# Patient Record
Sex: Male | Born: 1990 | Race: White | Hispanic: No | Marital: Married | Smoking: Never smoker
Health system: Southern US, Community
[De-identification: ages and names within clinical notes are randomized; demographics above are authoritative.]

## PROBLEM LIST (undated history)

## (undated) DIAGNOSIS — F419 Anxiety disorder, unspecified: Secondary | ICD-10-CM

## (undated) DIAGNOSIS — K219 Gastro-esophageal reflux disease without esophagitis: Secondary | ICD-10-CM

## (undated) HISTORY — DX: Gastro-esophageal reflux disease without esophagitis: K21.9

## (undated) HISTORY — DX: Anxiety disorder, unspecified: F41.9

---
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
## 2010-01-07 ENCOUNTER — Telehealth: Payer: Self-pay | Admitting: Family Medicine

## 2010-02-01 ENCOUNTER — Telehealth: Payer: Self-pay | Admitting: Family Medicine

## 2010-02-01 ENCOUNTER — Ambulatory Visit
Admission: RE | Admit: 2010-02-01 | Discharge: 2010-02-01 | Payer: Self-pay | Source: Home / Self Care | Attending: Family Medicine | Admitting: Family Medicine

## 2010-02-01 ENCOUNTER — Encounter: Payer: Self-pay | Admitting: Family Medicine

## 2010-02-10 NOTE — Letter (Signed)
Summary: Generic Letter  [place] [facility]  [address] [place], [place] [address]   Phone: [phone]  Fax: [phone]    02/01/2010  Regarding:  Ronald Gamble [address] [place], [place]  [address]  To Whom it May Concern,  Ronald Gamble is a patient of mine and had assessment on date above.  He has  no heart murmur and EKG is unremarkable.  We do not find any contraindications for engaging in physical training  activity for the [employer] at this time.         Sincerely,   Evelena Peat MD

## 2010-02-10 NOTE — Assessment & Plan Note (Signed)
Summary: needs ekg//ccm   Vital Signs:  Patient profile:   20 year old male Weight:      173 pounds Temp:     98.1 degrees F oral BP sitting:   110 / 78  (left arm) Cuff size:   regular  Vitals Entered By: Sid Falcon LPN (February 01, 2010 8:22 AM) CC: EKG needed for [employer]   History of Present Illness: Patient here for assessment prior to entering the [employer]. Was requested that he obtain EKG. Apparently he mentioned on intake questionnaire that he had history of heart murmur as a child. No history of syncope. No dizziness with exercise. No chest pains. Recent exercise with good tolerance. No history of any known arrhythmias or other heart problems. No history of asthma.  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    Physical Exam  General:  Well-developed,well-nourished,in no acute distress; alert,appropriate and cooperative throughout examination Ears:  External ear exam shows no significant lesions or deformities.  Otoscopic examination reveals clear canals, tympanic membranes are intact bilaterally without bulging, retraction, inflammation or discharge. Hearing is grossly normal bilaterally. Mouth:  Oral mucosa and oropharynx without lesions or exudates.  Teeth in good repair. Neck:  No deformities, masses, or tenderness noted. Lungs:  Normal respiratory effort, chest expands symmetrically. Lungs are clear to auscultation, no crackles or  wheezes. Heart:  Normal rate and regular rhythm. S1 and S2 normal without gallop, murmur, click, rub or other extra sounds. Extremities:  no edema   Impression & Recommendations:  Problem # 1:  SCREENING FOR UNSPECIFIED CONDITION (ICD-V82.9)  reported hx of murmur.  None noted today.  EKG is normal.  No contraindictions from exam or hx for  physical activity.  Orders: EKG w/ Interpretation (93000)  Complete Medication List: 1)  Naproxen 500 Mg Tabs (Naproxen) .... One by mouth q 12 hours as needed   Orders Added: 1)  EKG w/ Interpretation [93000] 2)  Est. Patient Level III [28413]

## 2010-02-10 NOTE — Letter (Signed)
Summary: Medical Clearance Form for College  Medical Clearance Form for College   Imported By: Maryln Gottron 12/22/2009 10:24:53  _____________________________________________________________________  External Attachment:    Type:   Image     Comment:   External Document

## 2010-02-10 NOTE — Assessment & Plan Note (Signed)
Summary: cpx/njr   Vital Signs:  Patient profile:   20 year old male Height:      67 inches Weight:      182 pounds O2 Sat:      97 % Temp:     98.1 degrees F Pulse rate:   82 / minute BP sitting:   120 / 84  (left arm)  Vitals Entered By: Pura Spice, RN (December 17, 2009 1:39 PM) CC: college form  completion    History of Present Illness: Patient here for well visit. 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 No chronic medical problems. Takes no medications. Allergy to Vantin.  No complaints at this time. Immunization records reviewed and are up-to-date. Negative PPD placed at outside institution that he'll try to get records.  he has no specific complaints at this time.  BP have been borderline elevated in past but OK today.  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 Alcohol use-no  Risk Factors: Smoking Status: never (12/08/2008) PMH-FH-SH reviewed for relevance  Review of Systems  The patient denies anorexia, fever, weight loss, weight gain, vision loss, decreased hearing, hoarseness, chest pain, syncope, dyspnea on exertion, peripheral edema, prolonged cough, headaches, hemoptysis, abdominal pain, melena, hematochezia, severe indigestion/heartburn, hematuria, incontinence, genital sores, muscle weakness, suspicious skin lesions, transient blindness, difficulty walking, depression, unusual weight change, abnormal bleeding, enlarged lymph nodes, and testicular masses.    Physical Exam  General:  Well-developed,well-nourished,in no acute distress; alert,appropriate and cooperative throughout examination Head:  Normocephalic and atraumatic without obvious abnormalities. No apparent  alopecia or balding. Eyes:  No corneal or conjunctival inflammation noted. EOMI. Perrla. Funduscopic exam benign, without hemorrhages, exudates or papilledema. Vision grossly normal. Ears:  External ear exam shows no significant lesions or deformities.  Otoscopic examination reveals clear canals, tympanic membranes are intact bilaterally without bulging, retraction, inflammation or discharge. Hearing is grossly normal bilaterally. Mouth:  Oral mucosa and oropharynx without lesions or exudates.  Teeth in good repair. Neck:  No deformities, masses, or tenderness noted. Lungs:  Normal respiratory effort, chest expands symmetrically. Lungs are clear to auscultation, no crackles or wheezes. Heart:  Normal rate and regular rhythm. S1 and S2 normal without gallop, murmur, click, rub or other extra sounds. Abdomen:  Bowel sounds positive,abdomen soft and non-tender without masses, organomegaly or hernias noted. Msk:  No deformity or scoliosis noted of thoracic or lumbar spine.   Extremities:  No clubbing, cyanosis, edema, or deformity noted with normal full range of motion of all joints.   Neurologic:  alert & oriented X3 and cranial nerves II-XII intact.   Skin:  no rashes and no suspicious lesions.   Cervical Nodes:  No lymphadenopathy noted   Impression & Recommendations:  Problem # 1:  Preventive Health Care (ICD-V70.0) Immunizations up to date.  Paperwork completed for EMS training.   Orders Added: 1)  Est. Patient 18-39 years [99395]

## 2010-02-10 NOTE — Letter (Signed)
Summary: Generic Letter  [place] [facility]  [address] [place], [place] [address]   Phone: [phone]  Fax: [phone]          02/01/2010     Ronald Gamble [address] [place], [place]  [address]  To Whom it May Concern,    Ronald Gamble is a patient of mine and had assessment on date above.  He has no heart murmur and his EKG is unremarkable.  We do not find any contraindications for engaging in physical training activity at this time.        Sincerely,       Evelena Peat, MD

## 2010-02-10 NOTE — Progress Notes (Signed)
Summary: abx request  Phone Note Call from Patient Call back at [phone]   Caller: Other Relative-grandmother Call For: Ronald Peat MD Summary of Call: Pt needs abx for sinus inf, pt is unable to come in today. [facility] Initial call taken by: Heron Sabins,  January 07, 2010 2:36 PM  Follow-up for Phone Call        Most of sinus infections we have seen are viral.  Try OTC mucinex, plenty of fluids, saline nasal irrigation.  If unilateral facial pain, progressive fever, purulent nasal discharge needs to be seen. Follow-up by: Ronald Peat MD,  January 07, 2010 5:18 PM  Additional Follow-up for Phone Call Additional follow up Details #1::        Pt informed Additional Follow-up by: Sid Falcon LPN,  January 07, 2010 5:25 PM

## 2010-02-10 NOTE — Assessment & Plan Note (Signed)
   Vital Signs:  Patient profile:   20 year old male Temp:     100.1 degrees F oral BP sitting:   110 / 70  (left arm)  Vitals Entered By: Sid Falcon LPN (December 29, 2009 4:30 PM)  History of Present Illness: 2 day hx of low grade fever.  Mild nasal congestion.  Rare cough Neck mild stiffness.  Mild body aches. No sore throat.  Increased fatigue.  Ibuprofen helps but wears off quickly.  Allergies: 1)  Vantin  Review of Systems      See HPI  Physical Exam  General:  Well-developed,well-nourished,in no acute distress; alert,appropriate and cooperative throughout examination Head:  Normocephalic and atraumatic without obvious abnormalities. No apparent alopecia or balding. Ears:  scarring but no acute changes. Mouth:  Oral mucosa and oropharynx without lesions or exudates.  Teeth in good repair. Neck:  No deformities, masses, or tenderness noted.supple L trapezius muscle tenderness. Lungs:  Normal respiratory effort, chest expands symmetrically. Lungs are clear to auscultation, no crackles or wheezes. Heart:  Normal rate and regular rhythm. S1 and S2 normal without gallop, murmur, click, rub or other extra sounds. Skin:  no rashes.   Cervical Nodes:  mildly tender AC nodes   Impression & Recommendations:  Problem # 1:  VIRAL INFECTION (ICD-079.99)  His updated medication list for this problem includes:    Naproxen 500 Mg Tabs (Naproxen) ..... One by mouth q 12 hours as needed  Complete Medication List: 1)  Naproxen 500 Mg Tabs (Naproxen) .... One by mouth q 12 hours as needed  Patient Instructions: 1)  Touch base if headaches ,fever, or other symptoms not better by next week. Prescriptions: NAPROXEN 500 MG TABS (NAPROXEN) one by mouth q 12 hours as needed  #30 x 0   Entered and Authorized by:   Evelena Peat MD   Signed by:   Evelena Peat MD on 12/29/2009   Method used:   Electronically to        [facility] [phone]* (retail)       [address]       [place], [place]  [address]       Ph: [phone] or [phone]       Fax: [phone]   RxID:   [id number]    Orders Added: 1)  Est. Patient Level III [34742]

## 2010-02-10 NOTE — Progress Notes (Signed)
Summary:  [employer]. Pls call re: letter that was sent  Phone Note Other Incoming Call back at [employer]  - [phone] or [phone] or [phone]     Caller: [employer]	[place]    - [employer] of Call: Called re: pts ekg. Pls call to discuss letter that was sent.  Initial call taken by: Lucy Antigua,  February 01, 2010 9:34 AM  Follow-up for Phone Call        Called numbers back, LMTCB Sid Falcon LPN  February 01, 2010 11:21 AM  Sgt Silverio Lay called to request the "[employer]" part of the letter be taken out and re-faxed to [phone]..  I will write additional letter and have Dr Caryl Never sign. Follow-up by: Sid Falcon LPN,  February 01, 2010 12:33 PM  Additional Follow-up for Phone Call Additional follow up Details #1::        Letter signed by Dr Caryl Never, faxed and confirmation received. Additional Follow-up by: Sid Falcon LPN,  February 01, 2010 2:28 PM

## 2014-08-10 ENCOUNTER — Ambulatory Visit
Admission: RE | Admit: 2014-08-10 | Discharge: 2014-08-10 | Disposition: A | Discharge status: Home / Self Care | Payer: No Typology Code available for payment source | Source: Ambulatory Visit | Attending: Occupational Medicine | Admitting: Occupational Medicine

## 2014-08-10 ENCOUNTER — Other Ambulatory Visit: Payer: Self-pay | Admitting: Occupational Medicine

## 2014-08-10 DIAGNOSIS — Z021 Encounter for pre-employment examination: Secondary | ICD-10-CM

## 2014-09-22 ENCOUNTER — Telehealth: Payer: Self-pay | Admitting: Family Medicine

## 2014-09-22 NOTE — Telephone Encounter (Signed)
error 

## 2014-09-22 NOTE — Telephone Encounter (Signed)
Pt wife call to ask if Dr Caryl Never will except

## 2017-10-08 ENCOUNTER — Encounter: Payer: Self-pay | Admitting: Family Medicine

## 2017-10-08 ENCOUNTER — Ambulatory Visit: Payer: BLUE CROSS/BLUE SHIELD | Admitting: Family Medicine

## 2017-10-08 ENCOUNTER — Other Ambulatory Visit: Payer: Self-pay

## 2017-10-08 VITALS — BP 130/78 | HR 99 | Temp 98.2°F | Ht 66.25 in | Wt 225.4 lb

## 2017-10-08 DIAGNOSIS — R1011 Right upper quadrant pain: Secondary | ICD-10-CM | POA: Diagnosis not present

## 2017-10-08 DIAGNOSIS — K219 Gastro-esophageal reflux disease without esophagitis: Secondary | ICD-10-CM

## 2017-10-08 LAB — HEPATIC FUNCTION PANEL
ALT: 135 U/L — ABNORMAL HIGH (ref 0–53)
AST: 77 U/L — ABNORMAL HIGH (ref 0–37)
Albumin: 4.5 g/dL (ref 3.5–5.2)
Alkaline Phosphatase: 75 U/L (ref 39–117)
BILIRUBIN DIRECT: 0.2 mg/dL (ref 0.0–0.3)
TOTAL PROTEIN: 7.5 g/dL (ref 6.0–8.3)
Total Bilirubin: 0.7 mg/dL (ref 0.2–1.2)

## 2017-10-08 LAB — LIPASE: LIPASE: 54 U/L (ref 11.0–59.0)

## 2017-10-08 LAB — BASIC METABOLIC PANEL
BUN: 7 mg/dL (ref 6–23)
CHLORIDE: 104 meq/L (ref 96–112)
CO2: 27 mEq/L (ref 19–32)
Calcium: 9.8 mg/dL (ref 8.4–10.5)
Creatinine, Ser: 0.78 mg/dL (ref 0.40–1.50)
GFR: 126.9 mL/min (ref >60.00)
Glucose, Bld: 122 mg/dL — ABNORMAL HIGH (ref 70–99)
POTASSIUM: 4.2 meq/L (ref 3.5–5.1)
Sodium: 141 mEq/L (ref 135–145)

## 2017-10-08 LAB — CBC WITH DIFFERENTIAL/PLATELET
BASOS PCT: 0.6 % (ref 0.0–3.0)
Basophils Absolute: 0.1 10*3/uL (ref 0.0–0.1)
EOS ABS: 0.1 10*3/uL (ref 0.0–0.7)
Eosinophils Relative: 1.2 % (ref 0.0–5.0)
HEMATOCRIT: 52.2 % — AB (ref 39.0–52.0)
Hemoglobin: 18.3 g/dL (ref 13.0–17.0)
LYMPHS ABS: 2.1 10*3/uL (ref 0.7–4.0)
Lymphocytes Relative: 25.7 % (ref 12.0–46.0)
MCHC: 35.1 g/dL (ref 30.0–36.0)
MCV: 98 fl (ref 78.0–100.0)
Monocytes Absolute: 0.7 10*3/uL (ref 0.1–1.0)
Monocytes Relative: 8.8 % (ref 3.0–12.0)
NEUTROS ABS: 5.3 10*3/uL (ref 1.4–7.7)
Neutrophils Relative %: 63.7 % (ref 43.0–77.0)
PLATELETS: 276 10*3/uL (ref 150.0–400.0)
RBC: 5.32 Mil/uL (ref 4.22–5.81)
RDW: 12.9 % (ref 11.5–15.5)
WBC: 8.3 10*3/uL (ref 4.0–10.5)

## 2017-10-08 NOTE — Patient Instructions (Signed)

## 2017-10-08 NOTE — Progress Notes (Signed)
  Subjective:     Patient ID: Ronald Gamble, male   DOB: 1990/07/04, 27 y.o.   MRN: [id number]  HPI Patient is seen to reestablish care.  He is concerned because he has had about 2 months now some intermittent upper abdominal pain.  Pain usually starts epigastric area but then radiates to the right upper quadrant.  He has noticed this after eating and especially after eating fatty foods more often.  Denies any associated nausea or vomiting.  No appetite or weight changes.  Pain usually last about 15 minutes but sometimes longer.  No history of ulcers.  No melena.  He is taken Pepto-Bismol with minimal relief  He does have frequent GERD symptoms.  He is recently started taking some Zantac which helps.  No dysphagia.  He had past history of borderline elevated blood pressure.  Takes no regular medications.  He was in [employer] in the [employer] until about 3 years ago.  He currently works in Clinical cytogeneticist.  He is divorced.  He has a son who started kindergarten this year.  Non-smoker.  No regular alcohol.  Past Medical History:  Diagnosis Date  . Anxiety   . GERD (gastroesophageal reflux disease)    History reviewed. No pertinent surgical history.  reports that he has never smoked. His smokeless tobacco use includes snuff. He reports that he drinks about 4.0 standard drinks of alcohol per week. He reports that he has current or past drug history. Drug: Marijuana. family history includes Cancer in his father and maternal grandfather; Depression in his mother; Diabetes in his paternal grandfather; Heart attack in his paternal grandfather. Allergies  Allergen Reactions  . Cefpodoxime Proxetil     REACTION: hives     Review of Systems  Constitutional: Negative for appetite change, chills, fever and unexpected weight change.  Respiratory: Negative for cough and shortness of breath.   Cardiovascular: Negative for chest pain.  Gastrointestinal: Positive for abdominal pain. Negative for  blood in stool, diarrhea, nausea and vomiting.  Genitourinary: Negative for dysuria.  Neurological: Negative for dizziness.       Objective:   Physical Exam  Constitutional: He appears well-developed and well-nourished.  HENT:  Mouth/Throat: Oropharynx is clear and moist.  Neck: Neck supple.  Cardiovascular: Normal rate and regular rhythm.  Pulmonary/Chest: Effort normal and breath sounds normal. He has no wheezes. He has no rales.  Abdominal: Soft. Bowel sounds are normal. He exhibits no distension and no mass. There is no tenderness. There is no rebound and no guarding.  Lymphadenopathy:    He has no cervical adenopathy.       Assessment:     #1 Patient is seen with 34-month history of intermittent mostly right upper quadrant abdominal pain.  Seem to be worse after eating.  He does not have any reproducible tenderness on exam but consider possible gallstones  #2 intermittent GERD symptoms    Plan:     -Obtain lab work with comprehensive metabolic panel, CBC, lipase -Set up abdominal ultrasound to further assess  Kristian Covey MD  Primary Care at [facility]

## 2017-10-10 ENCOUNTER — Telehealth: Payer: Self-pay | Admitting: Family Medicine

## 2017-10-10 ENCOUNTER — Encounter: Payer: Self-pay | Admitting: Family Medicine

## 2017-10-10 ENCOUNTER — Other Ambulatory Visit: Payer: Self-pay

## 2017-10-10 ENCOUNTER — Ambulatory Visit: Payer: BLUE CROSS/BLUE SHIELD | Admitting: Family Medicine

## 2017-10-10 VITALS — BP 112/64 | HR 92 | Temp 98.3°F | Ht 66.25 in | Wt 221.5 lb

## 2017-10-10 DIAGNOSIS — D751 Secondary polycythemia: Secondary | ICD-10-CM

## 2017-10-10 DIAGNOSIS — R740 Nonspecific elevation of levels of transaminase and lactic acid dehydrogenase [LDH]: Secondary | ICD-10-CM

## 2017-10-10 DIAGNOSIS — R7401 Elevation of levels of liver transaminase levels: Secondary | ICD-10-CM

## 2017-10-10 LAB — CBC WITH DIFFERENTIAL/PLATELET
BASOS PCT: 0.9 % (ref 0.0–3.0)
Basophils Absolute: 0.1 10*3/uL (ref 0.0–0.1)
EOS PCT: 1 % (ref 0.0–5.0)
Eosinophils Absolute: 0.1 10*3/uL (ref 0.0–0.7)
HCT: 53.6 % — ABNORMAL HIGH (ref 39.0–52.0)
Hemoglobin: 18.8 g/dL (ref 13.0–17.0)
Lymphocytes Relative: 20.3 % (ref 12.0–46.0)
Lymphs Abs: 2 10*3/uL (ref 0.7–4.0)
MCHC: 35.1 g/dL (ref 30.0–36.0)
MCV: 97.2 fl (ref 78.0–100.0)
MONO ABS: 0.9 10*3/uL (ref 0.1–1.0)
MONOS PCT: 8.8 % (ref 3.0–12.0)
NEUTROS ABS: 6.8 10*3/uL (ref 1.4–7.7)
NEUTROS PCT: 69 % (ref 43.0–77.0)
PLATELETS: 277 10*3/uL (ref 150.0–400.0)
RBC: 5.52 Mil/uL (ref 4.22–5.81)
RDW: 12.9 % (ref 11.5–15.5)
WBC: 9.9 10*3/uL (ref 4.0–10.5)

## 2017-10-10 LAB — FERRITIN: Ferritin: 309.3 ng/mL (ref 22.0–322.0)

## 2017-10-10 NOTE — Patient Instructions (Signed)
Try to avoid use of any nicotine products- especially no vaping.

## 2017-10-10 NOTE — Telephone Encounter (Signed)
FYI

## 2017-10-10 NOTE — Telephone Encounter (Signed)
Received a call from the lab.  Pt's has elevated hemoglobin of 18.8.

## 2017-10-10 NOTE — Progress Notes (Signed)
  Subjective:     Patient ID: Ronald Gamble, male   DOB: [birth year], 27 y.o.   MRN: [id number]  HPI  Patient was seen just couple days ago to reestablish care.  He had some vague right upper quadrant pains worse after eating.  Fairly low clinical suspicion for gallstones but we obtain some screening labs and this came back significant for elevated transaminases with AST 77 and ALT 135.  He also had mild polycythemia with hemoglobin 18.3 and hematocrit 52.2.  Patient thinks he has had previous hepatitis B vaccine.  He is not sure about hepatitis A vaccine.  Denies any recent fever, chills, night sweats, nausea or vomiting.  No recent stool changes.  He does drink whiskey about 3 times per week usually a few ounces per episode.  Rare binge drinking.  Quit using vapes about 3 months ago.  Does use dipping tobacco most days.  No cigarette use.  No family history of hemochromatosis.  No testosterone or hormonal use.  Past Medical History:  Diagnosis Date  . Anxiety   . GERD (gastroesophageal reflux disease)    History reviewed. No pertinent surgical history.  reports that he has never smoked. His smokeless tobacco use includes snuff. He reports that he drinks about 4.0 standard drinks of alcohol per week. He reports that he has current or past drug history. Drug: Marijuana. family history includes Cancer in his father and maternal grandfather; Depression in his mother; Diabetes in his paternal grandfather; Heart attack in his paternal grandfather. Allergies  Allergen Reactions  . Cefpodoxime Proxetil     REACTION: hives    Review of Systems  Constitutional: Negative for fatigue.  Eyes: Negative for visual disturbance.  Respiratory: Negative for cough, chest tightness and shortness of breath.   Cardiovascular: Negative for chest pain, palpitations and leg swelling.  Gastrointestinal: Positive for abdominal pain. Negative for abdominal distention, nausea and vomiting.  Genitourinary:  Negative for dysuria.  Neurological: Negative for dizziness, syncope, weakness, light-headedness and headaches.       Objective:   Physical Exam  Constitutional: He appears well-developed and well-nourished.  Cardiovascular: Normal rate and regular rhythm.  Pulmonary/Chest: Effort normal and breath sounds normal.  Neurological: He is alert.       Assessment:     #1 relatively mild polycythemia.  No known family history of polycythemia vera.  No cigarette use but has used vapes and tobacco through dipping.  No testosterone use  #2 elevated liver transaminases.  Based on pattern of ALT greater than AST doubt related to alcohol.  ?fatty liver.    Plan:     We will check further labs with ferritin, TIBC, serum iron, erythropoietin level, acute hepatitis panel, repeat CBC.  He already has abdominal ultrasound scheduled.  Kristian Covey MD [place] [facility] at [facility]

## 2017-10-10 NOTE — Telephone Encounter (Signed)
noted 

## 2017-10-12 ENCOUNTER — Ambulatory Visit
Admission: RE | Admit: 2017-10-12 | Discharge: 2017-10-12 | Disposition: A | Discharge status: Home / Self Care | Payer: BLUE CROSS/BLUE SHIELD | Source: Ambulatory Visit | Attending: Family Medicine | Admitting: Family Medicine

## 2017-10-12 DIAGNOSIS — R1011 Right upper quadrant pain: Secondary | ICD-10-CM | POA: Diagnosis not present

## 2017-10-12 LAB — ERYTHROPOIETIN: Erythropoietin: 4.7 m[IU]/mL (ref 2.6–18.5)

## 2017-10-12 LAB — HEPATITIS PANEL, ACUTE
HEP A IGM: NONREACTIVE
HEP B S AG: NONREACTIVE
Hep B C IgM: NONREACTIVE
Hepatitis C Ab: NONREACTIVE
SIGNAL TO CUT-OFF: 0.01 (ref <1.00)

## 2017-10-12 LAB — HEPATITIS B SURFACE ANTIBODY,QUALITATIVE: Hep B S Ab: REACTIVE — AB

## 2017-10-14 ENCOUNTER — Encounter: Payer: Self-pay | Admitting: Family Medicine

## 2017-10-31 ENCOUNTER — Encounter: Payer: Self-pay | Admitting: Family Medicine

## 2017-10-31 ENCOUNTER — Ambulatory Visit: Payer: BLUE CROSS/BLUE SHIELD | Admitting: Family Medicine

## 2017-10-31 ENCOUNTER — Other Ambulatory Visit: Payer: Self-pay

## 2017-10-31 VITALS — BP 124/70 | HR 101 | Temp 98.5°F | Ht 66.25 in | Wt 225.9 lb

## 2017-10-31 DIAGNOSIS — K219 Gastro-esophageal reflux disease without esophagitis: Secondary | ICD-10-CM | POA: Diagnosis not present

## 2017-10-31 DIAGNOSIS — R05 Cough: Secondary | ICD-10-CM | POA: Diagnosis not present

## 2017-10-31 DIAGNOSIS — R059 Cough, unspecified: Secondary | ICD-10-CM

## 2017-10-31 NOTE — Patient Instructions (Signed)
Food Choices for Gastroesophageal Reflux Disease, Adult When you have gastroesophageal reflux disease (GERD), the foods you eat and your eating habits are very important. Choosing the right foods can help ease the discomfort of GERD. Consider working with a diet and nutrition specialist (dietitian) to help you make healthy food choices. What general guidelines should I follow? Eating plan  Choose healthy foods low in fat, such as fruits, vegetables, whole grains, low-fat dairy products, and lean meat, fish, and poultry.  Eat frequent, small meals instead of three large meals each day. Eat your meals slowly, in a relaxed setting. Avoid bending over or lying down until 2-3 hours after eating.  Limit high-fat foods such as fatty meats or fried foods.  Limit your intake of oils, butter, and shortening to less than 8 teaspoons each day.  Avoid the following: ? Foods that cause symptoms. These may be different for different people. Keep a food diary to keep track of foods that cause symptoms. ? Alcohol. ? Drinking large amounts of liquid with meals. ? Eating meals during the 2-3 hours before bed.  Cook foods using methods other than frying. This may include baking, grilling, or broiling. Lifestyle   Maintain a healthy weight. Ask your health care provider what weight is healthy for you. If you need to lose weight, work with your health care provider to do so safely.  Exercise for at least 30 minutes on 5 or more days each week, or as told by your health care provider.  Avoid wearing clothes that fit tightly around your waist and chest.  Do not use any products that contain nicotine or tobacco, such as cigarettes and e-cigarettes. If you need help quitting, ask your health care provider.  Sleep with the head of your bed raised. Use a wedge under the mattress or blocks under the bed frame to raise the head of the bed. What foods are not recommended? The items listed may not be a complete  list. Talk with your dietitian about what dietary choices are best for you. Grains Pastries or quick breads with added fat. French toast. Vegetables Deep fried vegetables. French fries. Any vegetables prepared with added fat. Any vegetables that cause symptoms. For some people this may include tomatoes and tomato products, chili peppers, onions and garlic, and horseradish. Fruits Any fruits prepared with added fat. Any fruits that cause symptoms. For some people this may include citrus fruits, such as oranges, grapefruit, pineapple, and lemons. Meats and other protein foods High-fat meats, such as fatty beef or pork, hot dogs, ribs, ham, sausage, salami and bacon. Fried meat or protein, including fried fish and fried chicken. Nuts and nut butters. Dairy Whole milk and chocolate milk. Sour cream. Cream. Ice cream. Cream cheese. Milk shakes. Beverages Coffee and tea, with or without caffeine. Carbonated beverages. Sodas. Energy drinks. Fruit juice made with acidic fruits (such as [place] or grapefruit). Tomato juice. Alcoholic drinks. Fats and oils Butter. Margarine. Shortening. Ghee. Sweets and desserts Chocolate and cocoa. Donuts. Seasoning and other foods Pepper. Peppermint and spearmint. Any condiments, herbs, or seasonings that cause symptoms. For some people, this may include curry, hot sauce, or vinegar-based salad dressings. Summary  When you have gastroesophageal reflux disease (GERD), food and lifestyle choices are very important to help ease the discomfort of GERD.  Eat frequent, small meals instead of three large meals each day. Eat your meals slowly, in a relaxed setting. Avoid bending over or lying down until 2-3 hours after eating.  Limit high-fat   foods such as fatty meat or fried foods. This information is not intended to replace advice given to you by your health care provider. Make sure you discuss any questions you have with your health care provider. Document Released:  12/26/2004 Document Revised: 12/28/2015 Document Reviewed: 12/28/2015 Elsevier Interactive Patient Education  [employer].  I would add Pepcid 20 mg and take at night- about 12 hours apart from the Esomeprazole.  Consider elevate head of bed about 4-6 inches.

## 2017-10-31 NOTE — Progress Notes (Signed)
  Subjective:     Patient ID: Ronald Gamble, male   DOB: Aug 28, 1990, 27 y.o.   MRN: [id number]  HPI  Patient seen with cough which has been going on now for almost 2 months.  He has had some recent frequent GERD symptoms and started over-the-counter proton pump inhibitor 20 mg daily with Esomeprazole.  Still has frequent breakthrough GERD symptoms and especially when lying down supine.  He also has occasional postnasal drip symptoms.  Patient is a non-smoker.  Denies any dyspnea.  No wheezing.  No fevers or chills.  No appetite or weight changes.  Has actually had a couple pounds weight gain since last visit.  No history of chronic cough No daily ETOH use.  Past Medical History:  Diagnosis Date  . Anxiety   . GERD (gastroesophageal reflux disease)    History reviewed. No pertinent surgical history.  reports that he has never smoked. His smokeless tobacco use includes snuff. He reports that he drinks about 4.0 standard drinks of alcohol per week. He reports that he has current or past drug history. Drug: Marijuana. family history includes Cancer in his father and maternal grandfather; Depression in his mother; Diabetes in his paternal grandfather; Heart attack in his paternal grandfather. Allergies  Allergen Reactions  . Cefpodoxime Proxetil     REACTION: hives    Review of Systems  Constitutional: Negative for appetite change, chills, fever and unexpected weight change.  HENT: Positive for postnasal drip.   Respiratory: Positive for cough. Negative for shortness of breath and wheezing.   Cardiovascular: Negative for chest pain.       Objective:   Physical Exam  Constitutional: He appears well-developed and well-nourished.  HENT:  Right Ear: External ear normal.  Left Ear: External ear normal.  Mouth/Throat: Oropharynx is clear and moist.  Neck: Neck supple.  Cardiovascular: Normal rate and regular rhythm.  Pulmonary/Chest: Effort normal and breath sounds normal.   Abdominal: Soft. Bowel sounds are normal. There is no tenderness.  Lymphadenopathy:    He has no cervical adenopathy.       Assessment:     Patient presents with several week history of cough.  Nonfocal exam.  Contributing factors likely including GERD plus/minus postnasal drip    Plan:     -Consider elevate head of bed 4 to 6 inches -Avoid eating within 2 to 3 hours of bedtime -Discussed dietary management of GERD -Encouraged to lose some weight -We recommend he add Pepcid 20 mg once to twice daily in addition to his omeprazole 20 mg -Touch base if cough not improving over the next several weeks  Kristian Covey MD  [facility] at [facility]

## 2018-01-22 ENCOUNTER — Ambulatory Visit: Payer: BLUE CROSS/BLUE SHIELD | Admitting: Family Medicine

## 2018-01-31 ENCOUNTER — Telehealth: Payer: BLUE CROSS/BLUE SHIELD | Admitting: Family

## 2018-01-31 DIAGNOSIS — R05 Cough: Secondary | ICD-10-CM

## 2018-01-31 DIAGNOSIS — B9689 Other specified bacterial agents as the cause of diseases classified elsewhere: Secondary | ICD-10-CM

## 2018-01-31 DIAGNOSIS — J329 Chronic sinusitis, unspecified: Secondary | ICD-10-CM | POA: Diagnosis not present

## 2018-01-31 DIAGNOSIS — R059 Cough, unspecified: Secondary | ICD-10-CM

## 2018-01-31 MED ORDER — BENZONATATE 100 MG PO CAPS: 100.0000 mg | ORAL_CAPSULE | Freq: Three times a day (TID) | ORAL | 0 refills | Status: DC | PRN

## 2018-01-31 MED ORDER — DOXYCYCLINE HYCLATE 100 MG PO TABS: 100.0000 mg | ORAL_TABLET | Freq: Two times a day (BID) | ORAL | 0 refills | Status: DC

## 2018-01-31 NOTE — Progress Notes (Signed)
Thank you for the details you included in the comment boxes. Those details are very helpful in determining the best course of treatment for you and help us to provide the best care.  We are sorry that you are not feeling well.  Here is how we plan to help!  Based on what you have shared with me it looks like you have sinusitis.  Sinusitis is inflammation and infection in the sinus cavities of the head.  Based on your presentation I believe you most likely have Acute Bacterial Sinusitis.  This is an infection caused by bacteria and is treated with antibiotics. I have prescribed Doxycycline 100mg by mouth twice a day for 10 days. You may use an oral decongestant such as Mucinex D or if you have glaucoma or high blood pressure use plain Mucinex. Saline nasal spray help and can safely be used as often as needed for congestion.  If you develop worsening sinus pain, fever or notice severe headache and vision changes, or if symptoms are not better after completion of antibiotic, please schedule an appointment with a health care provider.    I have also sent Tessalon Perles 100mg, take 1-2 every 8 hours as needed for cough.    Sinus infections are not as easily transmitted as other respiratory infection, however we still recommend that you avoid close contact with loved ones, especially the very young and elderly.  Remember to wash your hands thoroughly throughout the day as this is the number one way to prevent the spread of infection!  Home Care:  Only take medications as instructed by your medical team.  Complete the entire course of an antibiotic.  Do not take these medications with alcohol.  A steam or ultrasonic humidifier can help congestion.  You can place a towel over your head and breathe in the steam from hot water coming from a faucet.  Avoid close contacts especially the very young and the elderly.  Cover your mouth when you cough or sneeze.  Always remember to wash your hands.  Get  Help Right Away If:  You develop worsening fever or sinus pain.  You develop a severe head ache or visual changes.  Your symptoms persist after you have completed your treatment plan.  Make sure you  Understand these instructions.  Will watch your condition.  Will get help right away if you are not doing well or get worse.  Your e-visit answers were reviewed by a board certified advanced clinical practitioner to complete your personal care plan.  Depending on the condition, your plan could have included both over the counter or prescription medications.  If there is a problem please reply  once you have received a response from your provider.  Your safety is important to us.  If you have drug allergies check your prescription carefully.    You can use MyChart to ask questions about today's visit, request a non-urgent call back, or ask for a work or school excuse for 24 hours related to this e-Visit. If it has been greater than 24 hours you will need to follow up with your provider, or enter a new e-Visit to address those concerns.  You will get an e-mail in the next two days asking about your experience.  I hope that your e-visit has been valuable and will speed your recovery. Thank you for using e-visits.    

## 2018-04-26 ENCOUNTER — Other Ambulatory Visit: Payer: Self-pay

## 2018-04-26 ENCOUNTER — Ambulatory Visit (INDEPENDENT_AMBULATORY_CARE_PROVIDER_SITE_OTHER): Payer: BLUE CROSS/BLUE SHIELD | Admitting: Family Medicine

## 2018-04-26 DIAGNOSIS — R1031 Right lower quadrant pain: Secondary | ICD-10-CM | POA: Diagnosis not present

## 2018-04-26 NOTE — Progress Notes (Signed)
Patient ID: Januel Nuon, male   DOB: Sep 16, 1990, 28 y.o.   MRN: [id number]  Virtual Visit via Video Note  I connected with Shelly Bombard on 04/26/18 at  8:30 AM EDT by a video enabled telemedicine application and verified that I am speaking with the correct person using two identifiers.  Location patient: home Location provider:work or home office Persons participating in the virtual visit: patient, provider  I discussed the limitations of evaluation and management by telemedicine and the availability of in person appointments. The patient expressed understanding and agreed to proceed.   HPI: Patient states Wednesday after eating lunch he noticed some pain right lower quadrant abdomen.  Symptoms are relatively mild and he described this as more of a "soreness ".  No appetite change.  No fever.  No chills.  No dysuria.  May have had some very mild flank pain.  No gross hematuria.  No history of kidney stones.  His pain has resolved since then.  He had no pain yesterday.  Eating well.  No exacerbating or alleviating factors.  No pain with ambulation or lying supine.   ROS: See pertinent positives and negatives per HPI.  Past Medical History:  Diagnosis Date  . Anxiety   . GERD (gastroesophageal reflux disease)     No past surgical history on file.  Family History  Problem Relation Age of Onset  . Depression Mother   . Cancer Father   . Cancer Maternal Grandfather   . Diabetes Paternal Grandfather   . Heart attack Paternal Grandfather     SOCIAL HX: Non-smoker   Current Outpatient Medications:  .  benzonatate (TESSALON PERLES) 100 MG capsule, Take 1-2 capsules (100-200 mg total) by mouth every 8 (eight) hours as needed for cough., Disp: 30 capsule, Rfl: 0 .  doxycycline (VIBRA-TABS) 100 MG tablet, Take 1 tablet (100 mg total) by mouth 2 (two) times daily., Disp: 20 tablet, Rfl: 0 .  Esomeprazole Magnesium 20 MG TBEC, Take 1 capsule by mouth daily., Disp: , Rfl:    EXAM:  VITALS per patient if applicable:  GENERAL: alert, oriented, appears well and in no acute distress  HEENT: atraumatic, conjunttiva clear, no obvious abnormalities on inspection of external nose and ears  NECK: normal movements of the head and neck  LUNGS: on inspection no signs of respiratory distress, breathing rate appears normal, no obvious gross SOB, gasping or wheezing  CV: no obvious cyanosis  MS: moves all visible extremities without noticeable abnormality  PSYCH/NEURO: pleasant and cooperative, no obvious depression or anxiety, speech and thought processing grossly intact  ASSESSMENT AND PLAN:  Discussed the following assessment and plan:  Transient right lower quadrant abdominal pain 2 days ago.  Patient is pain-free at this time.  Given the very transient nature of this in the absence of fever, loss of appetite, etc. doubt appendicitis.  -Follow-up for any recurrent pain, fever, loss of appetite, or any other concerns     I discussed the assessment and treatment plan with the patient. The patient was provided an opportunity to ask questions and all were answered. The patient agreed with the plan and demonstrated an understanding of the instructions.   The patient was advised to call back or seek an in-person evaluation if the symptoms worsen or if the condition fails to improve as anticipated.   Evelena Peat, MD

## 2019-04-25 ENCOUNTER — Telehealth: Payer: BC Managed Care – PPO | Admitting: Emergency Medicine

## 2019-04-25 DIAGNOSIS — B350 Tinea barbae and tinea capitis: Secondary | ICD-10-CM | POA: Diagnosis not present

## 2019-04-25 MED ORDER — GRISEOFULVIN ULTRAMICROSIZE 125 MG PO TABS
375.0000 mg | ORAL_TABLET | Freq: Every day | ORAL | 0 refills | Status: AC
Start: 2019-04-25 — End: 2019-05-25

## 2019-04-25 NOTE — Progress Notes (Signed)
E Visit for Rash  We are sorry that you are not feeling well. Here is how we plan to help!    Based upon your presentation it appears you have a fungal infection.  I have prescribed: Griseofulvin- This looks like Tinea Capitis, which unfortunately requires 4-6 weeks of treatment.  I've prescribed 4 weeks for you.  If you are noticing improvement at 3-4 weeks, but the rash has not gone away completely, you'll need to request a refill.  If you have no improvement after 2 weeks, you should be seen in-person, so that skin scrapings can be taken.   GET HELP RIGHT AWAY IF:   Symptoms don't go away after treatment.  Severe itching that persists.  If you rash spreads or swells.  If you rash begins to smell.  If it blisters and opens or develops a yellow-brown crust.  You develop a fever.  You have a sore throat.  You become short of breath.  MAKE SURE YOU:  Understand these instructions. Will watch your condition. Will get help right away if you are not doing well or get worse.  Thank you for choosing an e-visit. Your e-visit answers were reviewed by a board certified advanced clinical practitioner to complete your personal care plan. Depending upon the condition, your plan could have included both over the counter or prescription medications. Please review your pharmacy choice. Be sure that the pharmacy you have chosen is open so that you can pick up your prescription now.  If there is a problem you may message your provider in MyChart to have the prescription routed to another pharmacy. Your safety is important to [place]. If you have drug allergies check your prescription carefully.  For the next 24 hours, you can use MyChart to ask questions about today's visit, request a non-urgent call back, or ask for a work or school excuse from your e-visit provider. You will get an email in the next two days asking about your experience. I hope that your e-visit has been valuable and will speed  your recovery.     Greater than 5 minutes, yet less than 10 minutes was used in reviewing the patient's chart, questionnaire, prescribing medications, and documentation for this visit.

## 2019-09-18 DIAGNOSIS — Z20822 Contact with and (suspected) exposure to covid-19: Secondary | ICD-10-CM | POA: Diagnosis not present

## 2019-09-27 DIAGNOSIS — Z20822 Contact with and (suspected) exposure to covid-19: Secondary | ICD-10-CM | POA: Diagnosis not present

## 2019-12-13 ENCOUNTER — Telehealth: Payer: BC Managed Care – PPO | Admitting: Nurse Practitioner

## 2019-12-13 DIAGNOSIS — J0100 Acute maxillary sinusitis, unspecified: Secondary | ICD-10-CM

## 2019-12-13 MED ORDER — AMOXICILLIN-POT CLAVULANATE 875-125 MG PO TABS: 1.0000 | ORAL_TABLET | Freq: Two times a day (BID) | ORAL | 0 refills | Status: DC

## 2019-12-13 NOTE — Progress Notes (Signed)

## 2020-01-24 DIAGNOSIS — Z20822 Contact with and (suspected) exposure to covid-19: Secondary | ICD-10-CM | POA: Diagnosis not present

## 2020-02-05 DIAGNOSIS — Z20822 Contact with and (suspected) exposure to covid-19: Secondary | ICD-10-CM | POA: Diagnosis not present

## 2020-02-09 IMAGING — US US ABDOMEN COMPLETE
1 series · 13 of 25 positions shown · non-contrast
Comparison: None.

CLINICAL DATA: Upper abdominal pain for approximately 2 months

EXAM:
ABDOMEN ULTRASOUND COMPLETE

[Series 1: us abdomen complete · 0.20mm/px · 13 of 90 slices shown]
[im 1/90]
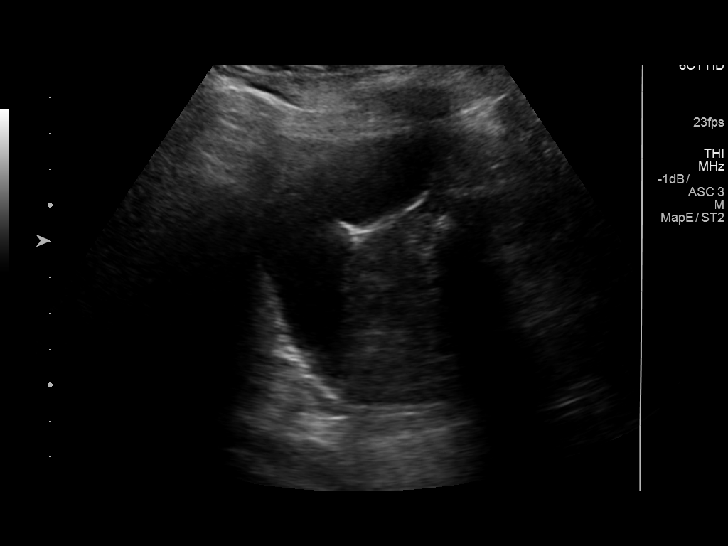
[im 8/90]
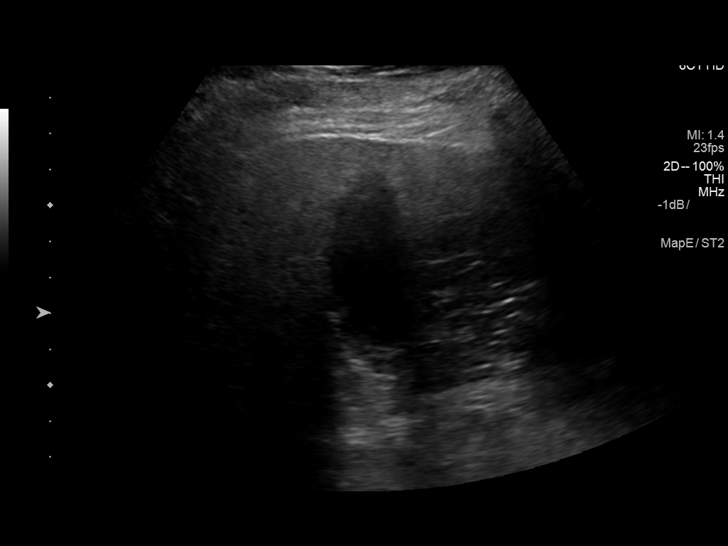
[im 15/90]
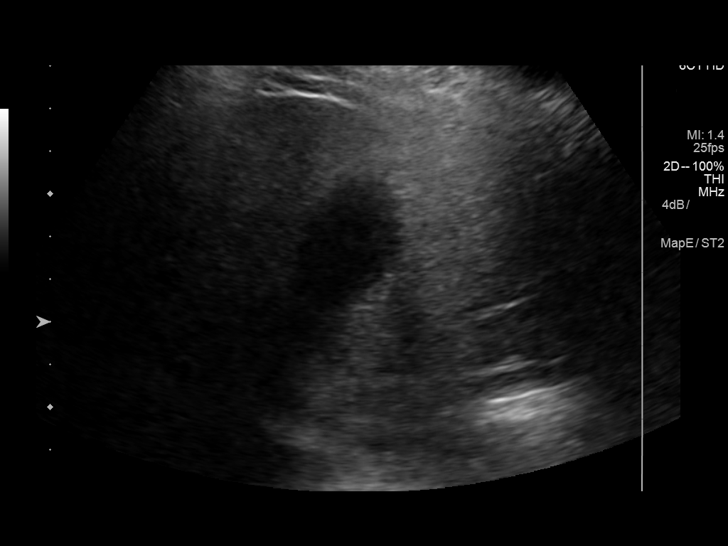
[im 23/90]
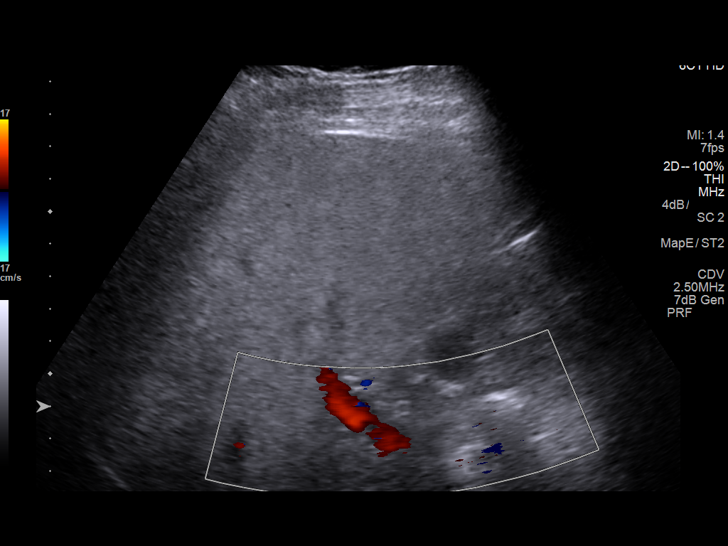
[im 30/90]
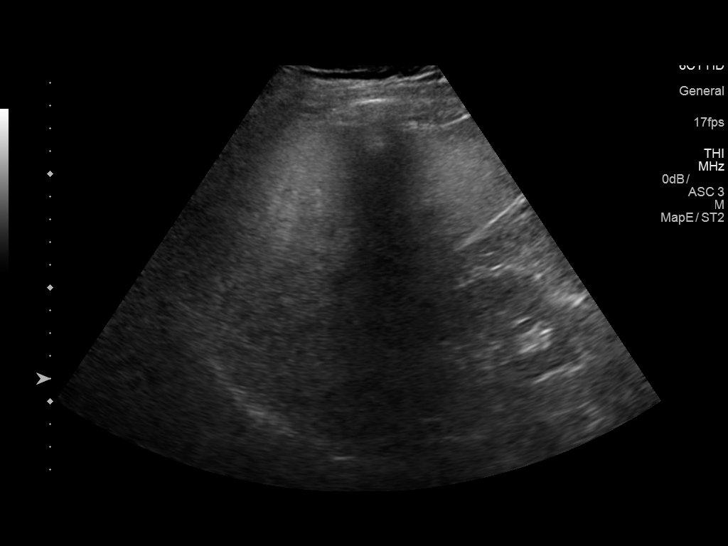
[im 38/90]
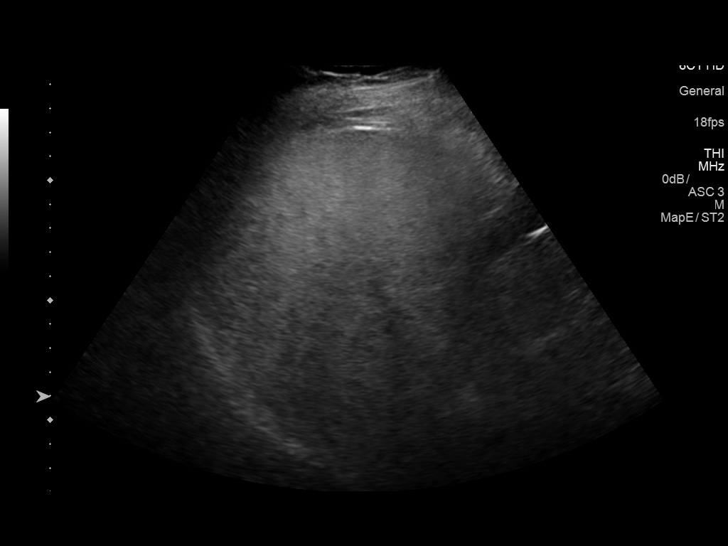
[im 45/90]
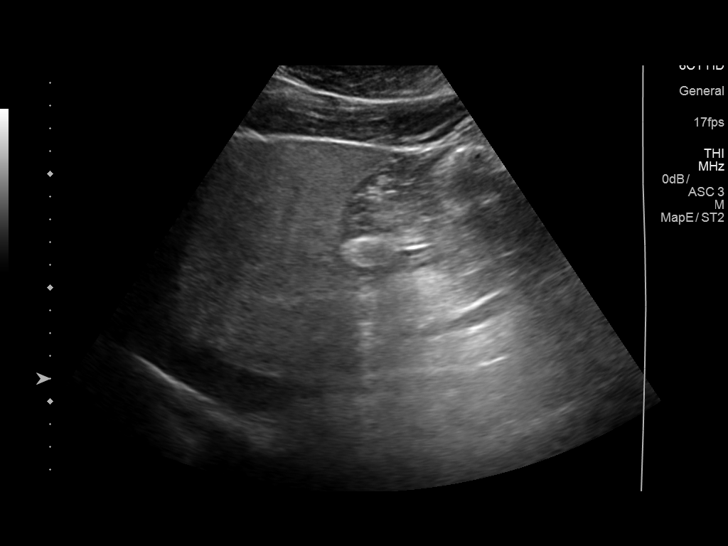
[im 52/90]
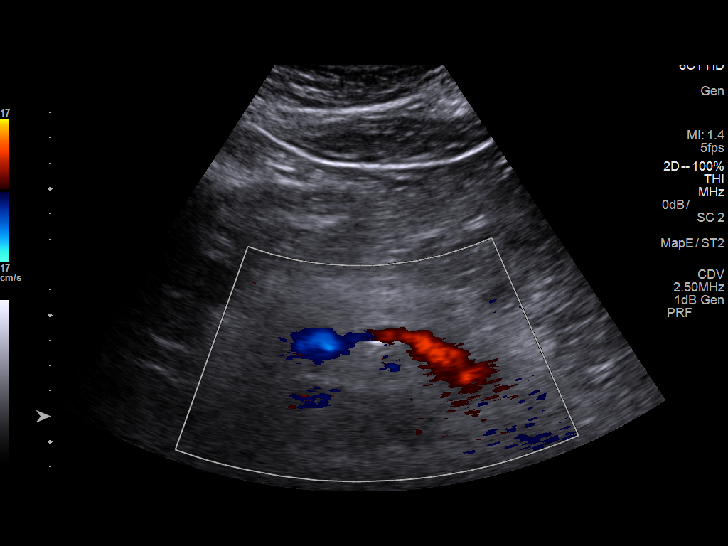
[im 60/90]
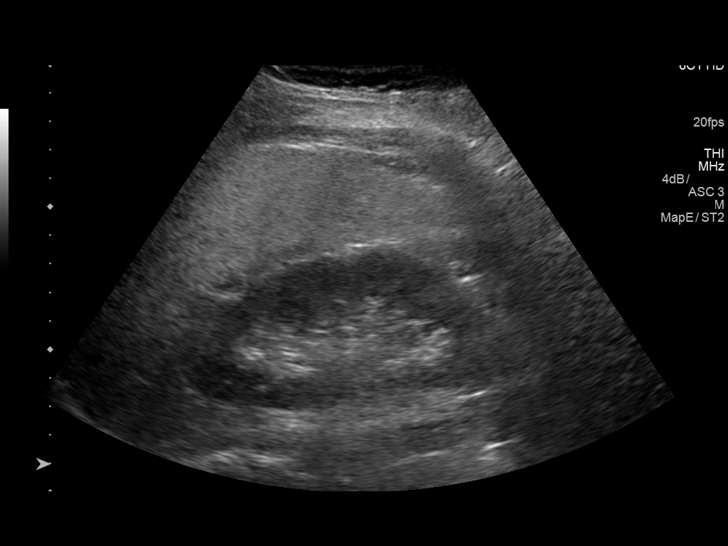
[im 67/90]
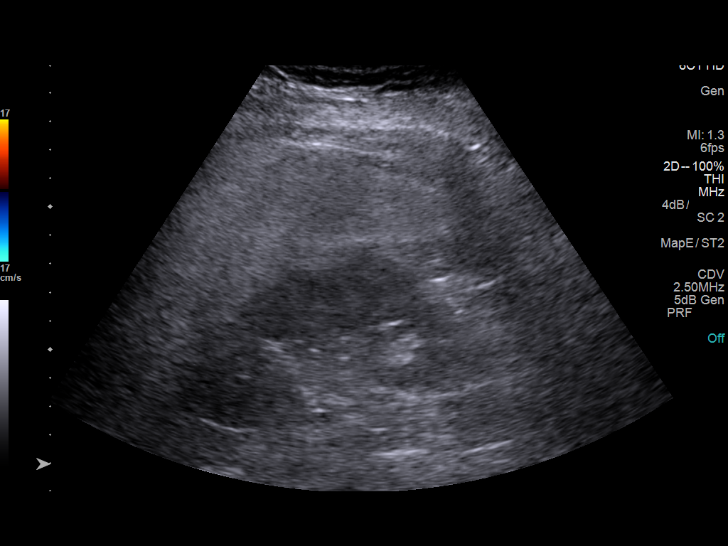
[im 75/90]
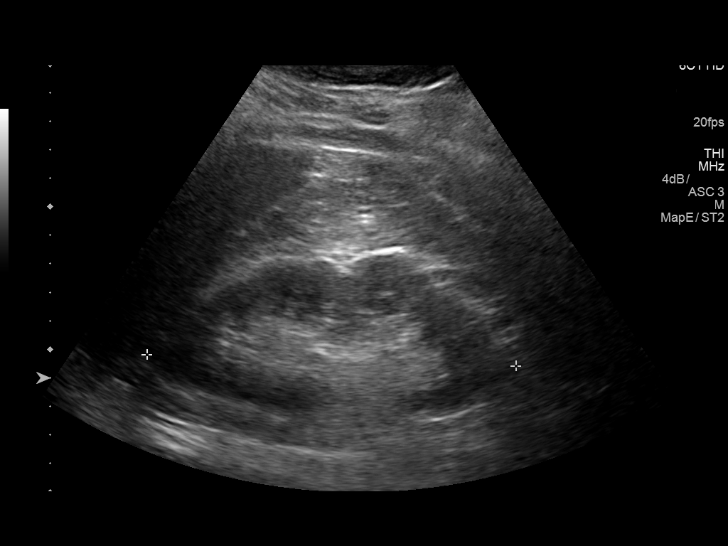
[im 82/90]
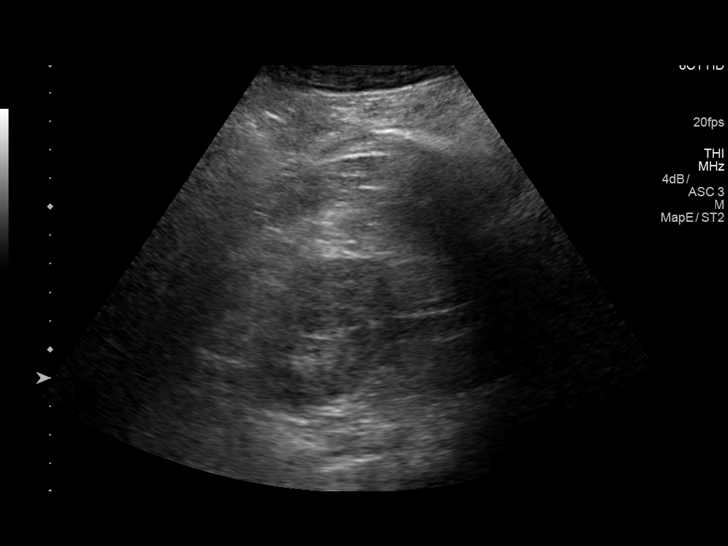
[im 90/90]
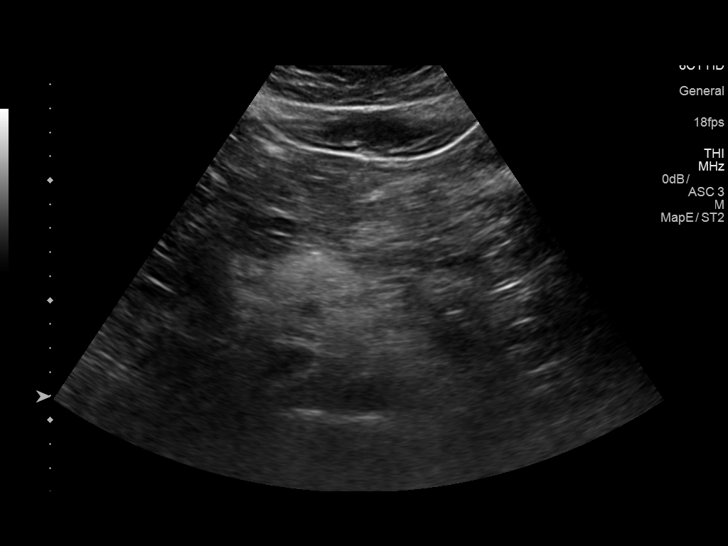

[13 of 25 positions shown; findings below may reference images not displayed]

FINDINGS: Gallbladder: No gallstones or wall thickening visualized. There is
no pericholecystic fluid. No sonographic Murphy sign noted by
sonographer.

Common bile duct: Diameter: 3 mm. No intrahepatic, common hepatic,
or common bile duct dilatation.

Liver: No focal lesion identified. Liver echogenicity is increased
diffusely. Portal vein is patent on color Doppler imaging with
normal direction of blood flow towards the liver.

IVC: No abnormality visualized.

Pancreas: No pancreatic mass or inflammatory focus.

Spleen: Size and appearance within normal limits.

Right Kidney: Length: 11.8 cm. Echogenicity within normal limits. No
mass or hydronephrosis visualized.

Left Kidney: Length: 12.9 cm. Echogenicity within normal limits. No
mass or hydronephrosis visualized.

Abdominal aorta: No aneurysm visualized.

Other findings: No demonstrable ascites.
IMPRESSION: Diffuse increase in liver echogenicity, a finding felt to be
indicative of hepatic steatosis. While no focal liver lesions are
evident on this study, it must be cautioned that the sensitivity of
ultrasound for detection of focal liver lesions is diminished in
this circumstance.

Study otherwise unremarkable.

## 2020-05-19 ENCOUNTER — Telehealth: Payer: BC Managed Care – PPO | Admitting: Family

## 2020-05-19 DIAGNOSIS — R21 Rash and other nonspecific skin eruption: Secondary | ICD-10-CM

## 2020-05-19 NOTE — Progress Notes (Signed)
Based on what you shared with me, I feel your condition warrants further evaluation and I recommend that you be seen in a face to face office visit.  Given your symptoms did not improve, I recommend being seen in person and may need to see a dermatologists.    NOTE: If you entered your credit card information for this eVisit, you will not be charged. You may see a "hold" on your card for the $35 but that hold will drop off and you will not have a charge processed.   If you are having a true medical emergency please call 911.      For an urgent face to face visit, [place] has six urgent care centers for your convenience:     [facility] at [facility] Directions [phone] [address] [place], [place] [address] . 8 am - 4 pm Monday - Friday    [facility]) Get Driving Directions [phone] [address] [place], [place] [address] . 8 am to 8 pm Monday-Friday . 10 am to 6 pm [facility]) Get Driving Directions [phone]  [address] [place],  [place]  [address] . 8 am to 8 pm Monday-Friday . 8 am to 4 pm [facility] Urgent Care at [facility] Get Driving Directions [phone] [address] [place], [place] [address] . 8 am to 8 pm Monday-Friday . 8 am to 4 pm [facility]gent Care at [facility] Get Driving Directions  [phone] [address] [place], [place] [address] . 8 am to 8 pm Monday-Friday . 8 am to 4 pm [facility] Urgent Care at [facility] Directions [phone] [address] [place], [place] [address] . 8 am to 8 pm Monday-Friday . 8 am to 4 pm Saturday-Sunday     Your MyChart E-visit questionnaire answers were reviewed by a board certified advanced clinical practitioner  to complete your personal care plan based on your specific symptoms.  Thank you for using e-Visits.

## 2020-05-21 ENCOUNTER — Encounter: Payer: Self-pay | Admitting: Family Medicine

## 2020-08-05 ENCOUNTER — Other Ambulatory Visit: Payer: Self-pay

## 2020-08-06 ENCOUNTER — Ambulatory Visit: Payer: BC Managed Care – PPO | Admitting: Family Medicine

## 2020-08-06 ENCOUNTER — Encounter: Payer: Self-pay | Admitting: Family Medicine

## 2020-08-06 VITALS — BP 154/106 | HR 87 | Temp 98.3°F | Ht 66.25 in | Wt 220.9 lb

## 2020-08-06 DIAGNOSIS — L409 Psoriasis, unspecified: Secondary | ICD-10-CM | POA: Diagnosis not present

## 2020-08-06 DIAGNOSIS — I10 Essential (primary) hypertension: Secondary | ICD-10-CM

## 2020-08-06 MED ORDER — CLOBETASOL PROPIONATE 0.05 % EX CREA: 1.0000 "application " | TOPICAL_CREAM | Freq: Two times a day (BID) | CUTANEOUS | 1 refills | Status: AC | PRN

## 2020-08-06 MED ORDER — LOSARTAN POTASSIUM 50 MG PO TABS: 50.0000 mg | ORAL_TABLET | Freq: Every day | ORAL | 1 refills | Status: DC

## 2020-08-06 NOTE — Progress Notes (Signed)
Established Patient Office Visit  Subjective:  Patient ID: Ronald Gamble, male    DOB: 10-05-1990  Age: 30 y.o. MRN: [id number]  CC:  Chief Complaint  Patient presents with   Rash    Patient complains of rash, x 1 year,    HPI Ronald Gamble presents for the following items  Progressive skin rash over the past year.  He currently has patches behind the right ear, left thigh, left forearm, right lower abdomen.  These have erythematous base and thick silvery scaly surface.  He initially thought this was ringworm.  He tried some over-the-counter hydrocortisone cream without any relief.  These are asymptomatic.  No pain.  No itching.  No exacerbating or alleviating factors.  Other issue is elevated blood pressure.  He is donated blood several times and states his blood pressures usually 160 over about 100.  Does have positive family history of hypertension.  He recently had gained weight and over the past couple months has lost 20 pounds due to diet and exercise.  He hopes to lose more.  Does drink some alcohol but generally only on weekends.  No regular binge drinking.  Has also restricted his salt intake some.  Past Medical History:  Diagnosis Date   Anxiety    GERD (gastroesophageal reflux disease)     History reviewed. No pertinent surgical history.  Family History  Problem Relation Age of Onset   Depression Mother    Cancer Father    Cancer Maternal Grandfather    Diabetes Paternal Grandfather    Heart attack Paternal Grandfather     Social History   Socioeconomic History   Marital status: Divorced    Spouse name: Not on file   Number of children: Not on file   Years of education: Not on file   Highest education level: Not on file  Occupational History   Not on file  Tobacco Use   Smoking status: Never   Smokeless tobacco: Current    Types: Snuff  Vaping Use   Vaping Use: Former  Substance and Sexual Activity   Alcohol use: Yes     Alcohol/week: 4.0 standard drinks    Types: 4 Shots of liquor per week    Comment: On weekends   Drug use: Not Currently    Types: Marijuana   Sexual activity: Yes    Birth control/protection: Condom, Pill  Other Topics Concern   Not on file  Social History Narrative   Not on file   Social Determinants of Health   Financial Resource Strain: Not on file  Food Insecurity: Not on file  Transportation Needs: Not on file  Physical Activity: Not on file  Stress: Not on file  Social Connections: Not on file  Intimate Partner Violence: Not on file    Outpatient Medications Prior to Visit  Medication Sig Dispense Refill   amoxicillin-clavulanate (AUGMENTIN) 875-125 MG tablet Take 1 tablet by mouth 2 (two) times daily. 14 tablet 0   benzonatate (TESSALON PERLES) 100 MG capsule Take 1-2 capsules (100-200 mg total) by mouth every 8 (eight) hours as needed for cough. 30 capsule 0   doxycycline (VIBRA-TABS) 100 MG tablet Take 1 tablet (100 mg total) by mouth 2 (two) times daily. 20 tablet 0   Esomeprazole Magnesium 20 MG TBEC Take 1 capsule by mouth daily.     No facility-administered medications prior to visit.    Allergies  Allergen Reactions   Cefpodoxime Proxetil     REACTION: hives  ROS Review of Systems  Constitutional:  Negative for fatigue.  Eyes:  Negative for visual disturbance.  Respiratory:  Negative for cough, chest tightness and shortness of breath.   Cardiovascular:  Negative for chest pain, palpitations and leg swelling.  Skin:  Positive for rash.  Neurological:  Negative for dizziness, syncope, weakness, light-headedness and headaches.     Objective:    Physical Exam Constitutional:      Appearance: He is well-developed.  HENT:     Right Ear: External ear normal.     Left Ear: External ear normal.  Eyes:     Pupils: Pupils are equal, round, and reactive to light.  Neck:     Thyroid: No thyromegaly.  Cardiovascular:     Rate and Rhythm: Normal rate  and regular rhythm.  Pulmonary:     Effort: Pulmonary effort is normal. No respiratory distress.     Breath sounds: Normal breath sounds. No wheezing or rales.  Musculoskeletal:     Cervical back: Neck supple.  Skin:    Comments: He has scattered areas of rash including left thigh, left forearm, right lower abdomen, posterior to the right ear.  He has erythematous rash with thick scaly surface.  With exception of the area behind the right ear these are mostly circumferential.  Well demarcated borders.  Neurological:     Mental Status: He is alert and oriented to person, place, and time.    BP (!) 154/106 (BP Location: Left Arm, Cuff Size: Normal)   Pulse 87   Temp 98.3 F (36.8 C) (Oral)   Ht 5' 6.25" (1.683 m)   Wt 220 lb 14.4 oz (100.2 kg)   SpO2 97%   BMI 35.39 kg/m  Wt Readings from Last 3 Encounters:  08/06/20 220 lb 14.4 oz (100.2 kg)  10/31/17 225 lb 14.4 oz (102.5 kg)  10/10/17 221 lb 8 oz (100.5 kg)     Health Maintenance Due  Topic Date Due   COVID-19 Vaccine (1) Never done   HIV Screening  Never done   TETANUS/TDAP  01/09/2018    There are no preventive care reminders to display for this patient.  No results found for: TSH Lab Results  Component Value Date   WBC 9.9 10/10/2017   HGB 18.8 Repeated and verified X2. (HH) 10/10/2017   HCT 53.6 (H) 10/10/2017   MCV 97.2 10/10/2017   PLT 277.0 10/10/2017   Lab Results  Component Value Date   NA 141 10/08/2017   K 4.2 10/08/2017   CO2 27 10/08/2017   GLUCOSE 122 (H) 10/08/2017   BUN 7 10/08/2017   CREATININE 0.78 10/08/2017   BILITOT 0.7 10/08/2017   ALKPHOS 75 10/08/2017   AST 77 (H) 10/08/2017   ALT 135 (H) 10/08/2017   PROT 7.5 10/08/2017   ALBUMIN 4.5 10/08/2017   CALCIUM 9.8 10/08/2017   GFR 126.90 10/08/2017   No results found for: CHOL No results found for: HDL No results found for: LDLCALC No results found for: TRIG No results found for: CHOLHDL No results found for: JIRC7E     Assessment & Plan:   #1 psoriasis skin rash. -Recommend trial of clobetasol 0.05% cream to use twice daily no more than 2 weeks continuously.  Avoid use on face. -Set up dermatology referral.  We explained this is frequently challenging to manage and can sometimes even require systemic therapy  #2 hypertension currently untreated.  Multiple elevated readings recently  -Information on DASH diet given -Continue weight loss efforts -Keep  sodium intake less than 2500 mg daily -Start losartan 50 mg daily and bring back in 3 to 4 weeks to reassess and check basic metabolic panel then.   Meds ordered this encounter  Medications   clobetasol cream (TEMOVATE) 0.05 %    Sig: Apply 1 application topically 2 (two) times daily as needed.    Dispense:  30 g    Refill:  1   losartan (COZAAR) 50 MG tablet    Sig: Take 1 tablet (50 mg total) by mouth daily.    Dispense:  30 tablet    Refill:  1    Follow-up: Return in about 3 weeks (around 08/27/2020).    Evelena Peat, MD

## 2020-09-10 DIAGNOSIS — L309 Dermatitis, unspecified: Secondary | ICD-10-CM | POA: Diagnosis not present

## 2020-10-02 ENCOUNTER — Other Ambulatory Visit: Payer: Self-pay | Admitting: Family Medicine

## 2020-11-30 ENCOUNTER — Other Ambulatory Visit: Payer: Self-pay | Admitting: Family Medicine

## 2021-01-29 ENCOUNTER — Other Ambulatory Visit: Payer: Self-pay | Admitting: Family Medicine

## 2021-03-09 DIAGNOSIS — L400 Psoriasis vulgaris: Secondary | ICD-10-CM | POA: Diagnosis not present

## 2021-03-27 ENCOUNTER — Other Ambulatory Visit: Payer: Self-pay | Admitting: Family Medicine

## 2021-05-03 ENCOUNTER — Other Ambulatory Visit: Payer: Self-pay | Admitting: Family Medicine

## 2021-07-26 ENCOUNTER — Other Ambulatory Visit: Payer: Self-pay | Admitting: Family Medicine

## 2021-08-24 ENCOUNTER — Telehealth: Payer: BC Managed Care – PPO | Admitting: Nurse Practitioner

## 2021-08-24 DIAGNOSIS — B353 Tinea pedis: Secondary | ICD-10-CM

## 2021-08-24 MED ORDER — TERBINAFINE HCL 250 MG PO TABS: 250.0000 mg | ORAL_TABLET | Freq: Every day | ORAL | 0 refills | Status: DC

## 2021-08-24 MED ORDER — CLOTRIMAZOLE 1 % EX CREA: 1.0000 | TOPICAL_CREAM | Freq: Two times a day (BID) | CUTANEOUS | 0 refills | Status: AC

## 2021-08-24 NOTE — Progress Notes (Signed)
E-Visit for Athlete's Foot  We are sorry that you are not feeling well. Here is how we plan to help!  Based on what you shared with me it looks like you have tinea pedis, or "Athlete's Foot".  This type of rash can spread through shared towels, clothing, bedding, etc., as well as hard surfaces (particularly in moist areas) such as shower stalls, locker room floors, pool areas, etc. The symptoms of Athlete's Foot include red, swollen, peeling, itchy skin between the toes (especially between the pinky toe and the one next to it). The sole and heel of the foot may also be affected. In severe cases, the skin on the feet can blister.  Athlete's foot can usually be treated with over-the-counter topical antifungal products; but sometimes with chronic or extensive tinea pedis, prescription oral medications are needed.   I am recommending:Clotrimazole 1% cream or gel, apply to area twice per day   Prescription medications are only indicated for an extensive rash or if over the counter treatments have failed.  I am prescribing:Terbinafine 250 mg once per day for one week  HOME CARE:  Keep feet clean, dry, and cool. Avoid using swimming pools, public showers, or foot baths. Wear sandals when possible or air shoes out by alternating them every 2-3 days. Avoid wearing closed shoes and wearing socks made from fabric that doesn't dry easily (for example, nylon). Treat the infection with recommended medication  GET HELP RIGHT AWAY IF:  Symptoms that don't go away after treatment. Severe itching that persists. If your rash spreads or swells. If your rash begins to have drainage or smell. You develop a fever.  MAKE SURE YOU   Understand these instructions. Will watch your condition. Will get help right away if you are not doing well or get worse.   Thank you for choosing an e-visit.  Your e-visit answers were reviewed by a board certified advanced clinical practitioner to complete your  personal care plan. Depending upon the condition, your plan could have included both over the counter or prescription medications.  Please review your pharmacy choice. Make sure the pharmacy is open so you can pick up prescription now. If there is a problem, you may contact your provider through [employer] and have the prescription routed to another pharmacy.  Your safety is important to [place]. If you have drug allergies check your prescription carefully.   For the next 24 hours you can use MyChart to ask questions about today's visit, request a non-urgent call back, or ask for a work or school excuse.  You will get an email in the next two days asking about your experience. I hope that your e-visit has been valuable and will speed your recovery  References or for more information:  [employer][email or web address]?search=athletes%27foot%[email or web address]&source=search_result&selectedTitle=1~104&usage_type=default&display_rank=1  [email or web address]   5-10 minutes spent reviewing and documenting in chart.

## 2021-10-05 ENCOUNTER — Other Ambulatory Visit: Payer: Self-pay | Admitting: Family Medicine

## 2021-10-13 ENCOUNTER — Other Ambulatory Visit: Payer: Self-pay

## 2021-10-13 ENCOUNTER — Telehealth: Payer: Self-pay | Admitting: Family Medicine

## 2021-10-13 DIAGNOSIS — I10 Essential (primary) hypertension: Secondary | ICD-10-CM

## 2021-10-13 MED ORDER — LOSARTAN POTASSIUM 50 MG PO TABS: 50.0000 mg | ORAL_TABLET | Freq: Every day | ORAL | 0 refills | Status: DC

## 2021-10-13 NOTE — Telephone Encounter (Signed)
Refill sent to [facility] in [place]

## 2021-10-13 NOTE — Telephone Encounter (Signed)
Pt has an appt on 11-02-2021 and losartan (COZAAR) 50 MG tablet  [id number] [place],  - [address] Phone:  [phone]  Fax:  [phone]

## 2021-11-02 ENCOUNTER — Ambulatory Visit: Payer: BC Managed Care – PPO | Admitting: Family Medicine

## 2021-11-02 ENCOUNTER — Encounter: Payer: Self-pay | Admitting: Family Medicine

## 2021-11-02 VITALS — BP 132/96 | HR 106 | Temp 98.0°F | Ht 66.25 in | Wt 208.9 lb

## 2021-11-02 DIAGNOSIS — F410 Panic disorder [episodic paroxysmal anxiety] without agoraphobia: Secondary | ICD-10-CM | POA: Diagnosis not present

## 2021-11-02 DIAGNOSIS — I10 Essential (primary) hypertension: Secondary | ICD-10-CM | POA: Diagnosis not present

## 2021-11-02 MED ORDER — LOSARTAN POTASSIUM 100 MG PO TABS: 100.0000 mg | ORAL_TABLET | Freq: Every day | ORAL | 3 refills | Status: DC

## 2021-11-02 MED ORDER — SERTRALINE HCL 50 MG PO TABS: 50.0000 mg | ORAL_TABLET | Freq: Every day | ORAL | 3 refills | Status: DC

## 2021-11-02 NOTE — Progress Notes (Signed)
Established Patient Office Visit  Subjective   Patient ID: Ronald Gamble, male    DOB: [birth year]  Age: 31 y.o. MRN: [id number]  Chief Complaint  Patient presents with   Medication Refill    HPI   Ronald Gamble is here for the following issues  He has history of hypertension.  He has been consistently taking losartan 50 mg daily.  His had several readings recently up around 140 systolic and 90 diastolic.  No headaches.  He has lost about 11 pounds since last year and hopes to get his weight down below 190.  He does drink some alcohol on weekends but not on a daily basis.  Non-smoker.  No recent dizziness.  No chest pains.  He relates intermittent anxiety symptoms that can be quite severe at times.  These come on suddenly and in different environments and he has multiple somatic symptoms including heart racing, sometimes diaphoresis, shortness of breath, palpitations.  These can last for several minutes.  He has been concerned about possible panic attacks.  These seem to be coming on more frequently in recent months.  Denies any specific stressors.  Denies any depression symptoms.  Past Medical History:  Diagnosis Date   Anxiety    GERD (gastroesophageal reflux disease)    History reviewed. No pertinent surgical history.  reports that he has never smoked. His smokeless tobacco use includes snuff. He reports current alcohol use of about 4.0 standard drinks of alcohol per week. He reports that he does not currently use drugs after having used the following drugs: Marijuana. family history includes Cancer in his father and maternal grandfather; Depression in his mother; Diabetes in his paternal grandfather; Heart attack in his paternal grandfather. Allergies  Allergen Reactions   Cefpodoxime Proxetil     REACTION: hives    Review of Systems  Constitutional:  Negative for malaise/fatigue.  Eyes:  Negative for blurred vision.  Respiratory:  Negative for shortness of breath.    Cardiovascular:  Negative for chest pain.  Neurological:  Negative for dizziness, weakness and headaches.  Psychiatric/Behavioral:  Negative for depression and suicidal ideas. The patient is nervous/anxious.       Objective:     BP (!) 132/96 (BP Location: Left Arm, Cuff Size: Normal)   Pulse (!) 106   Temp 98 F (36.7 C) (Oral)   Ht 5' 6.25" (1.683 m)   Wt 208 lb 14.4 oz (94.8 kg)   SpO2 99%   BMI 33.46 kg/m  BP Readings from Last 3 Encounters:  11/02/21 (!) 132/96  08/06/20 (!) 154/106  10/31/17 124/70   Wt Readings from Last 3 Encounters:  11/02/21 208 lb 14.4 oz (94.8 kg)  08/06/20 220 lb 14.4 oz (100.2 kg)  10/31/17 225 lb 14.4 oz (102.5 kg)      Physical Exam Vitals reviewed.  Constitutional:      Appearance: He is well-developed.  HENT:     Right Ear: External ear normal.     Left Ear: External ear normal.  Eyes:     Pupils: Pupils are equal, round, and reactive to light.  Neck:     Thyroid: No thyromegaly.  Cardiovascular:     Rate and Rhythm: Normal rate and regular rhythm.  Pulmonary:     Effort: Pulmonary effort is normal. No respiratory distress.     Breath sounds: Normal breath sounds. No wheezing or rales.  Musculoskeletal:     Cervical back: Neck supple.  Neurological:     Mental Status: He is  alert and oriented to person, place, and time.  Psychiatric:        Mood and Affect: Mood normal.        Thought Content: Thought content normal.      No results found for any visits on 11/02/21.    The ASCVD Risk score (Arnett DK, et al., 2019) failed to calculate for the following reasons:   The 2019 ASCVD risk score is only valid for ages 9 to 5    Assessment & Plan:   #1 hypertension suboptimally controlled.  -Discussed nonpharmacologic management with weight loss, regular exercise, sodium reduction, alcohol reduction -Increase losartan to 100 mg daily and set up follow-up in about 3 to 4 weeks to reassess -Handout on Dash diet  given  #2 probable panic disorder.  Patient has history of recurrent acute episodes of anxiety with multiple symptoms suggestive of panic disorder. -We discussed trial of sertraline 50 mg once daily.  He is aware this may take a couple weeks to see any significant effects.  Reassess at follow-up.   Return in about 1 month (around 12/03/2021).    Carolann Littler, MD

## 2021-11-02 NOTE — Patient Instructions (Signed)
Monitor blood pressure and be in touch if  not consistently < 140/90.

## 2021-11-24 ENCOUNTER — Other Ambulatory Visit: Payer: Self-pay | Admitting: Family Medicine

## 2021-12-07 ENCOUNTER — Encounter: Payer: Self-pay | Admitting: Family Medicine

## 2021-12-07 ENCOUNTER — Ambulatory Visit: Payer: BC Managed Care – PPO | Admitting: Family Medicine

## 2021-12-07 VITALS — BP 118/78 | HR 104 | Temp 97.9°F | Ht 66.25 in | Wt 207.5 lb

## 2021-12-07 DIAGNOSIS — I10 Essential (primary) hypertension: Secondary | ICD-10-CM | POA: Diagnosis not present

## 2021-12-07 DIAGNOSIS — F410 Panic disorder [episodic paroxysmal anxiety] without agoraphobia: Secondary | ICD-10-CM

## 2021-12-07 MED ORDER — SERTRALINE HCL 50 MG PO TABS: 50.0000 mg | ORAL_TABLET | Freq: Every day | ORAL | 3 refills | Status: AC

## 2021-12-07 NOTE — Progress Notes (Signed)
Established Patient Office Visit  Subjective   Patient ID: Ronald Gamble, male    DOB: 10-14-1990  Age: 31 y.o. MRN: [id number]  Chief Complaint  Patient presents with   Follow-up    HPI   Ronald Gamble is here for follow-up hypertension and panic disorder.  We bumped his losartan up to 100 mg daily last visit.  He has not been monitoring at home but blood pressure much improved today.  No headaches or dizziness.  He has history of panic disorder and was having increased anxiety symptoms.  We started sertraline.  He had some loose stools for about 1 week but none since then.  No nausea or vomiting.  Anxiety symptoms much improved at this time.  He also feels less agitated with stress issues.  He has had no breakthrough panic attacks since starting the sertraline and is very pleased with that.  Past Medical History:  Diagnosis Date   Anxiety    GERD (gastroesophageal reflux disease)    History reviewed. No pertinent surgical history.  reports that he has never smoked. His smokeless tobacco use includes snuff. He reports current alcohol use of about 4.0 standard drinks of alcohol per week. He reports that he does not currently use drugs after having used the following drugs: Marijuana. family history includes Cancer in his father and maternal grandfather; Depression in his mother; Diabetes in his paternal grandfather; Heart attack in his paternal grandfather. Allergies  Allergen Reactions   Cefpodoxime Proxetil     REACTION: hives    Review of Systems  Constitutional:  Negative for malaise/fatigue.  Eyes:  Negative for blurred vision.  Respiratory:  Negative for shortness of breath.   Cardiovascular:  Negative for chest pain.  Neurological:  Negative for dizziness, weakness and headaches.      Objective:     BP 118/78 (BP Location: Left Arm, Patient Position: Sitting, Cuff Size: Large)   Pulse (!) 104   Temp 97.9 F (36.6 C) (Oral)   Ht 5' 6.25" (1.683 m)   Wt 207 lb  8 oz (94.1 kg)   SpO2 98%   BMI 33.24 kg/m  BP Readings from Last 3 Encounters:  12/07/21 118/78  11/02/21 (!) 132/96  08/06/20 (!) 154/106   Wt Readings from Last 3 Encounters:  12/07/21 207 lb 8 oz (94.1 kg)  11/02/21 208 lb 14.4 oz (94.8 kg)  08/06/20 220 lb 14.4 oz (100.2 kg)      Physical Exam Vitals reviewed.  Constitutional:      Appearance: He is well-developed.  Neck:     Thyroid: No thyromegaly.  Cardiovascular:     Rate and Rhythm: Normal rate and regular rhythm.  Pulmonary:     Effort: Pulmonary effort is normal. No respiratory distress.     Breath sounds: Normal breath sounds. No wheezing or rales.  Musculoskeletal:     Cervical back: Neck supple.  Neurological:     Mental Status: He is alert and oriented to person, place, and time.      No results found for any visits on 12/07/21.    The ASCVD Risk score (Arnett DK, et al., 2019) failed to calculate for the following reasons:   The 2019 ASCVD risk score is only valid for ages 71 to 62    Assessment & Plan:   #1 hypertension improved following recent titration of losartan.  Continue losartan 100 mg daily.  Continue lifestyle modification with weight loss, regular aerobic exercise, sodium reduction Recheck 6 months  #2  history of panic disorder improved on sertraline.  Continue sertraline 50 mg daily.  Be in touch for increasing breakthrough symptoms  Evelena Peat, MD

## 2022-01-18 ENCOUNTER — Encounter: Payer: Self-pay | Admitting: Family Medicine

## 2022-01-20 ENCOUNTER — Telehealth (INDEPENDENT_AMBULATORY_CARE_PROVIDER_SITE_OTHER): Payer: BC Managed Care – PPO | Admitting: Family Medicine

## 2022-01-20 ENCOUNTER — Encounter: Payer: Self-pay | Admitting: Family Medicine

## 2022-01-20 VITALS — Ht 66.25 in | Wt 207.5 lb

## 2022-01-20 DIAGNOSIS — F410 Panic disorder [episodic paroxysmal anxiety] without agoraphobia: Secondary | ICD-10-CM | POA: Diagnosis not present

## 2022-01-20 DIAGNOSIS — F419 Anxiety disorder, unspecified: Secondary | ICD-10-CM | POA: Diagnosis not present

## 2022-01-20 MED ORDER — ARIPIPRAZOLE 5 MG PO TABS: 5.0000 mg | ORAL_TABLET | Freq: Every day | ORAL | 1 refills | Status: DC

## 2022-01-20 NOTE — Progress Notes (Signed)
Patient ID: Ronald Gamble, male   DOB: Sep 07, 1990, 32 y.o.   MRN: [id number]   Virtual Visit via Video Note  I connected with Ronald Gamble on 01/20/22 at  1:15 PM EST by a video enabled telemedicine application and verified that I am speaking with the correct person using two identifiers.  Location patient: home Location provider:work or home office Persons participating in the virtual visit: patient, provider  I discussed the limitations of evaluation and management by telemedicine and the availability of in person appointments. The patient expressed understanding and agreed to proceed.   HPI:  Just was recently started on sertraline for anxiety symptoms with panic disorder type symptoms.  He sent a note couple days ago stating that he initially felt better after few weeks but the past week or 2 felt like things were returning back to normal with increased anxiety but also some agitation and anger symptoms.  Also occasional periods of feeling overemotional.  He does have a history of bipolar disorder in his mother.  He has not had any extreme agitation.  We did administer mood disorder questionnaire.  He did have 7 out of 13 positive responses.  Occasional periods of increased energy and being more active than usual.  No suicidal ideation.  He does relate difficulties dealing with stress at times.  Has considered counseling.   ROS: See pertinent positives and negatives per HPI.  Past Medical History:  Diagnosis Date   Anxiety    GERD (gastroesophageal reflux disease)     History reviewed. No pertinent surgical history.  Family History  Problem Relation Age of Onset   Depression Mother    Cancer Father    Cancer Maternal Grandfather    Diabetes Paternal Grandfather    Heart attack Paternal Grandfather     SOCIAL HX: Non-smoker   Current Outpatient Medications:    ARIPiprazole (ABILIFY) 5 MG tablet, Take 1 tablet (5 mg total) by mouth daily., Disp: 30 tablet, Rfl:  1   clobetasol cream (TEMOVATE) 2.84 %, Apply 1 application topically 2 (two) times daily as needed., Disp: 30 g, Rfl: 1   clotrimazole (CLOTRIMAZOLE ANTI-FUNGAL) 1 % cream, Apply 1 Application topically 2 (two) times daily., Disp: 30 g, Rfl: 0   Esomeprazole Magnesium 20 MG TBEC, Take 1 capsule by mouth daily., Disp: , Rfl:    losartan (COZAAR) 100 MG tablet, Take 1 tablet (100 mg total) by mouth daily., Disp: 90 tablet, Rfl: 3   sertraline (ZOLOFT) 50 MG tablet, Take 1 tablet (50 mg total) by mouth daily., Disp: 90 tablet, Rfl: 3  EXAM:  VITALS per patient if applicable:  GENERAL: alert, oriented, appears well and in no acute distress  HEENT: atraumatic, conjunttiva clear, no obvious abnormalities on inspection of external nose and ears  NECK: normal movements of the head and neck  LUNGS: on inspection no signs of respiratory distress, breathing rate appears normal, no obvious gross SOB, gasping or wheezing  CV: no obvious cyanosis  MS: moves all visible extremities without noticeable abnormality  PSYCH/NEURO: pleasant and cooperative, no obvious depression or anxiety, speech and thought processing grossly intact  ASSESSMENT AND PLAN:  Discussed the following assessment and plan:  History of panic disorder.  Patient also relates some depression and agitation issues at times.  Positive family history of bipolar disorder.  7 out of 13 positive on mood disorder questionnaire.  -Patient had questions regarding counseling and we do agree that may be beneficial.  He was given contact information to  set up if interested -Discussed possible trial of mood stabilizer such as Abilify 5 mg nightly.  Touch base in a few weeks for follow-up to give some feedback -We might need to consider bumping his sertraline up to 100 mg but would start low-dose Abilify first.     I discussed the assessment and treatment plan with the patient. The patient was provided an opportunity to ask questions and  all were answered. The patient agreed with the plan and demonstrated an understanding of the instructions.   The patient was advised to call back or seek an in-person evaluation if the symptoms worsen or if the condition fails to improve as anticipated.     Carolann Littler, MD

## 2022-02-13 ENCOUNTER — Other Ambulatory Visit: Payer: Self-pay | Admitting: Family Medicine

## 2022-02-15 NOTE — Telephone Encounter (Signed)
I spoke with the patient and he reported he is responding positively to medication so far and has not really noticed a big change. Rx sent

## 2022-03-08 DIAGNOSIS — L400 Psoriasis vulgaris: Secondary | ICD-10-CM | POA: Diagnosis not present

## 2022-03-08 DIAGNOSIS — L0212 Furuncle of neck: Secondary | ICD-10-CM | POA: Diagnosis not present

## 2022-06-07 ENCOUNTER — Encounter: Payer: BC Managed Care – PPO | Admitting: Family Medicine

## 2022-07-11 ENCOUNTER — Encounter: Payer: BC Managed Care – PPO | Admitting: Family Medicine

## 2022-08-16 ENCOUNTER — Encounter: Payer: BC Managed Care – PPO | Admitting: Family Medicine

## 2022-09-12 ENCOUNTER — Other Ambulatory Visit: Payer: Self-pay | Admitting: Family Medicine

## 2022-10-11 ENCOUNTER — Telehealth: Payer: BC Managed Care – PPO

## 2022-10-11 ENCOUNTER — Encounter: Payer: Self-pay | Admitting: Family Medicine

## 2022-10-17 ENCOUNTER — Other Ambulatory Visit: Payer: Self-pay | Admitting: Family Medicine

## 2022-10-18 ENCOUNTER — Encounter: Payer: Self-pay | Admitting: Family Medicine

## 2022-10-18 ENCOUNTER — Telehealth (INDEPENDENT_AMBULATORY_CARE_PROVIDER_SITE_OTHER): Payer: Managed Care, Other (non HMO) | Admitting: Family Medicine

## 2022-10-18 VITALS — Ht 66.25 in | Wt 224.0 lb

## 2022-10-18 DIAGNOSIS — R635 Abnormal weight gain: Secondary | ICD-10-CM | POA: Diagnosis not present

## 2022-10-18 DIAGNOSIS — I10 Essential (primary) hypertension: Secondary | ICD-10-CM

## 2022-10-18 MED ORDER — LOSARTAN POTASSIUM 100 MG PO TABS: 100.0000 mg | ORAL_TABLET | Freq: Every day | ORAL | 3 refills | Status: DC

## 2022-10-18 MED ORDER — AMLODIPINE BESYLATE 5 MG PO TABS: 5.0000 mg | ORAL_TABLET | Freq: Every day | ORAL | 3 refills | Status: DC

## 2022-10-18 NOTE — Patient Instructions (Signed)
Add amlodipine 5 mg daily to losartan 100 mg daily  Monitor blood pressure and be in touch if not consistently less than 130/80  Recommend home blood pressure monitor (arm and not wrist)-and monitor blood pressure at least a few times per week  Continue to work on weight loss as discussed  Try to keep daily sodium intake less than 2000 mg
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
## 2022-10-23 NOTE — Telephone Encounter (Signed)
Patient informed rx was sent on 10/18/2022

## 2022-10-23 NOTE — Telephone Encounter (Signed)
[facility] [place], [place] - [address] Phone: [phone]  Fax: [phone]       Called on behalf of the Pt to ask why Rx was denied?  Pt states he needs this refill sent, at your earliest convenience.

## 2022-10-25 ENCOUNTER — Other Ambulatory Visit: Payer: Self-pay | Admitting: Family Medicine

## 2023-01-18 ENCOUNTER — Encounter: Payer: Self-pay | Admitting: Family Medicine

## 2023-01-19 MED ORDER — AMLODIPINE BESYLATE 5 MG PO TABS: 5.0000 mg | ORAL_TABLET | Freq: Every day | ORAL | 2 refills | Status: AC

## 2023-01-19 MED ORDER — LOSARTAN POTASSIUM 100 MG PO TABS: 100.0000 mg | ORAL_TABLET | Freq: Every day | ORAL | 2 refills | Status: AC
